# Patient Record
Sex: Male | Born: 1990 | Race: White | Hispanic: No | Marital: Single | Smoking: Current some day smoker
Health system: Southern US, Community
[De-identification: ages and names within clinical notes are randomized; demographics above are authoritative.]

---

## 2017-11-05 ENCOUNTER — Ambulatory Visit (HOSPITAL_COMMUNITY)
Admission: EM | Admit: 2017-11-05 | Discharge: 2017-11-05 | Disposition: A | Discharge status: Home / Self Care | Payer: 59 | Attending: Family Medicine | Admitting: Family Medicine

## 2017-11-05 ENCOUNTER — Ambulatory Visit (INDEPENDENT_AMBULATORY_CARE_PROVIDER_SITE_OTHER): Payer: 59

## 2017-11-05 ENCOUNTER — Encounter (HOSPITAL_COMMUNITY): Payer: Self-pay | Admitting: Emergency Medicine

## 2017-11-05 DIAGNOSIS — S2242XA Multiple fractures of ribs, left side, initial encounter for closed fracture: Secondary | ICD-10-CM

## 2017-11-05 MED ORDER — HYDROCODONE-ACETAMINOPHEN 7.5-325 MG PO TABS: 1.0000 | ORAL_TABLET | Freq: Four times a day (QID) | ORAL | 0 refills | Status: AC | PRN

## 2017-11-05 MED ORDER — IBUPROFEN 800 MG PO TABS: 800.0000 mg | ORAL_TABLET | Freq: Three times a day (TID) | ORAL | 0 refills | Status: AC | PRN

## 2017-11-05 NOTE — Discharge Instructions (Signed)
Take the ibuprofen 3 x a day with food Take the hydrocodone for more severe pain Take deep breaths frequently Watch for signs of chest infection -fever, body aches, cough with sputum production.  Return if this develops Rib fractures take 4-6 weeks to heal
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
## 2019-06-17 IMAGING — DX DG RIBS W/ CHEST 3+V*L*
3 series · 3 of 3 positions shown · non-contrast
Comparison: None.

CLINICAL DATA: Patient states that he fell and hit left ribs on
railing of porch. Pain with coughing and laughing and laying down.
Current smoker- [DATE] ppd.

EXAM:
LEFT RIBS AND CHEST - 3+ VIEW

[chest pa]
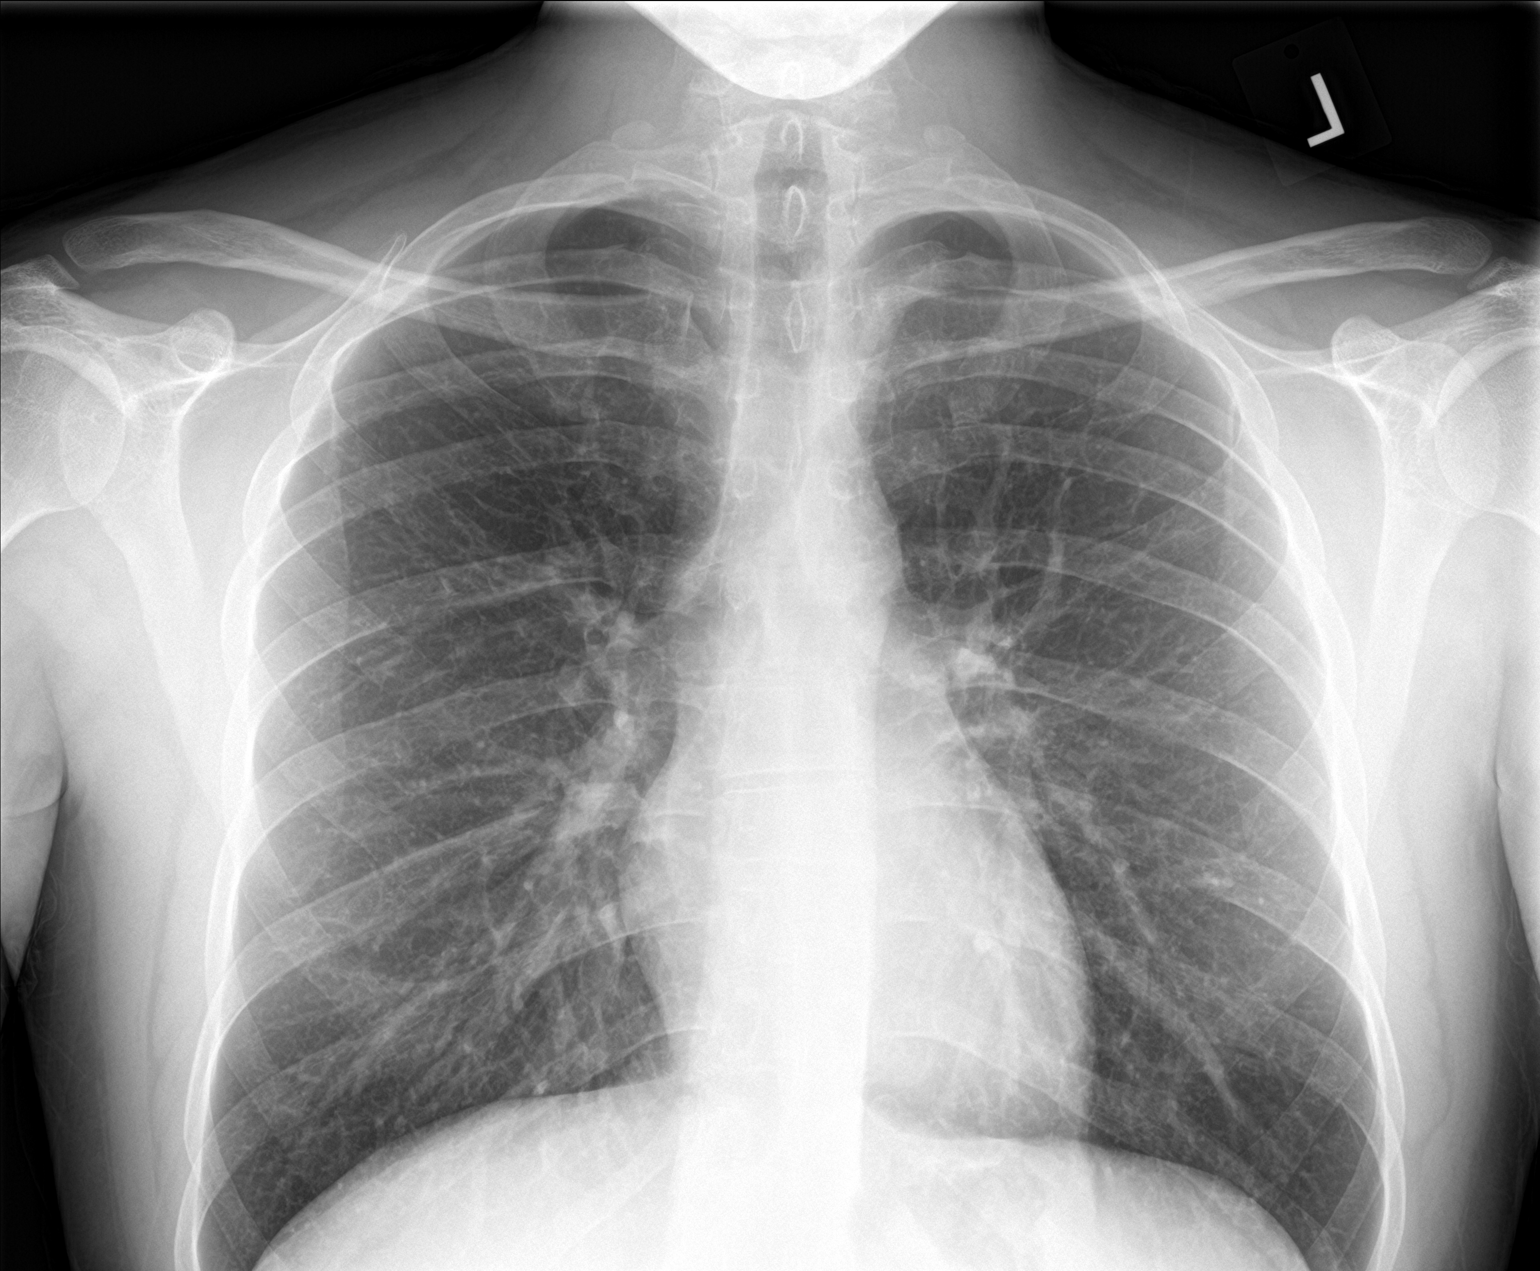

[rib obl]
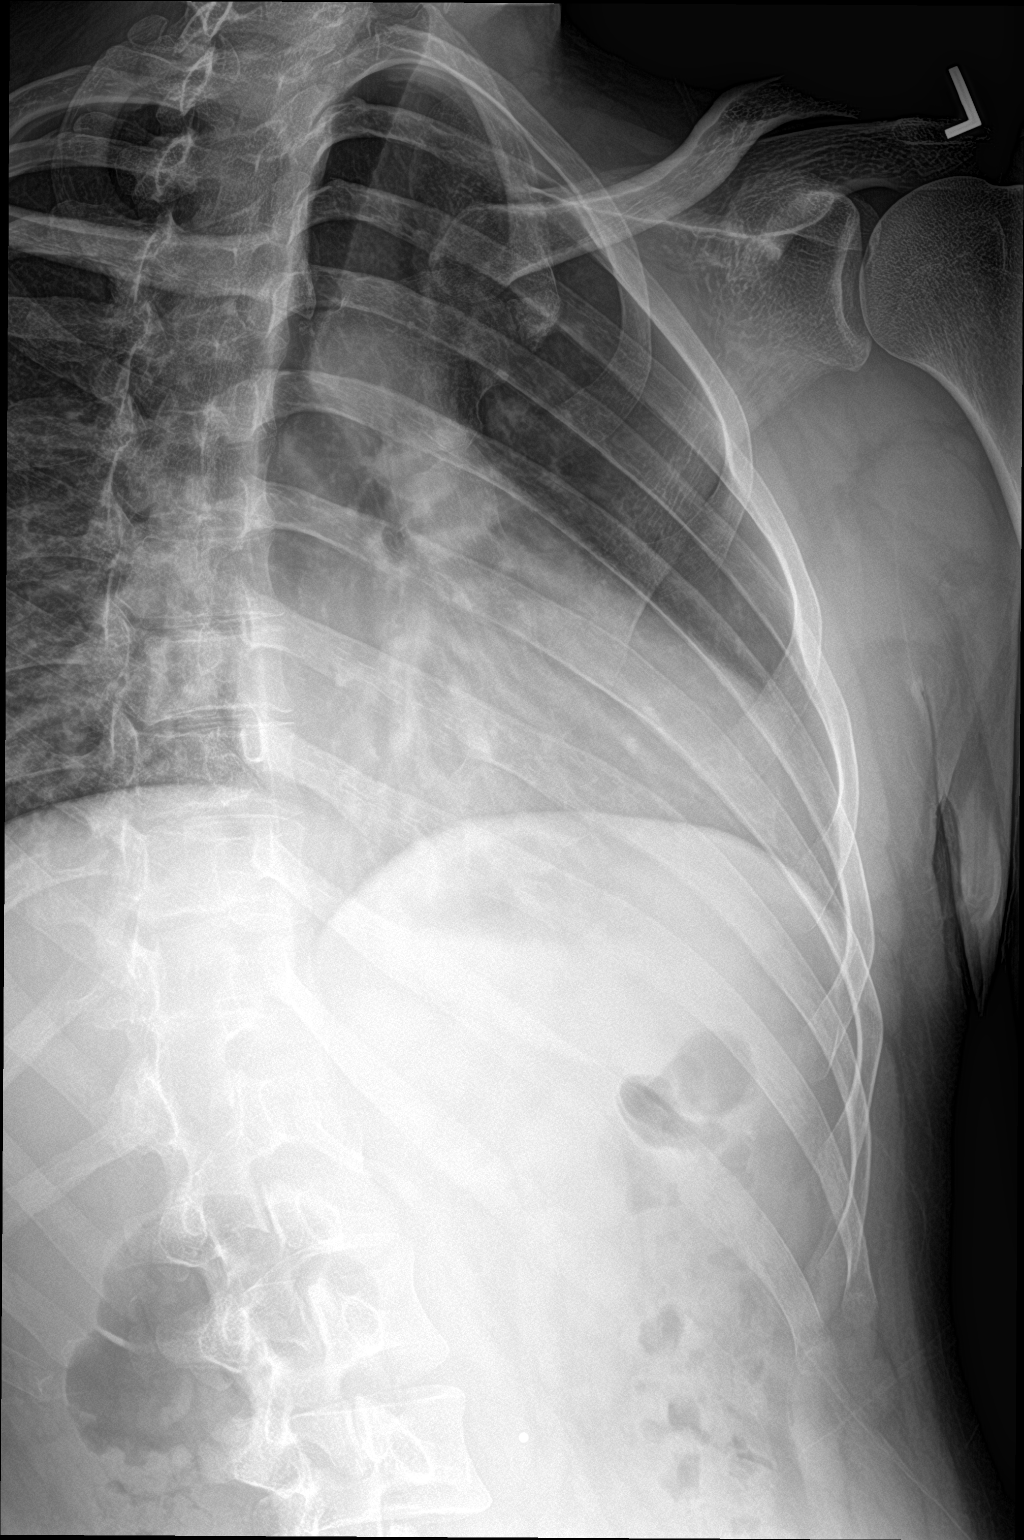

[rib pa]
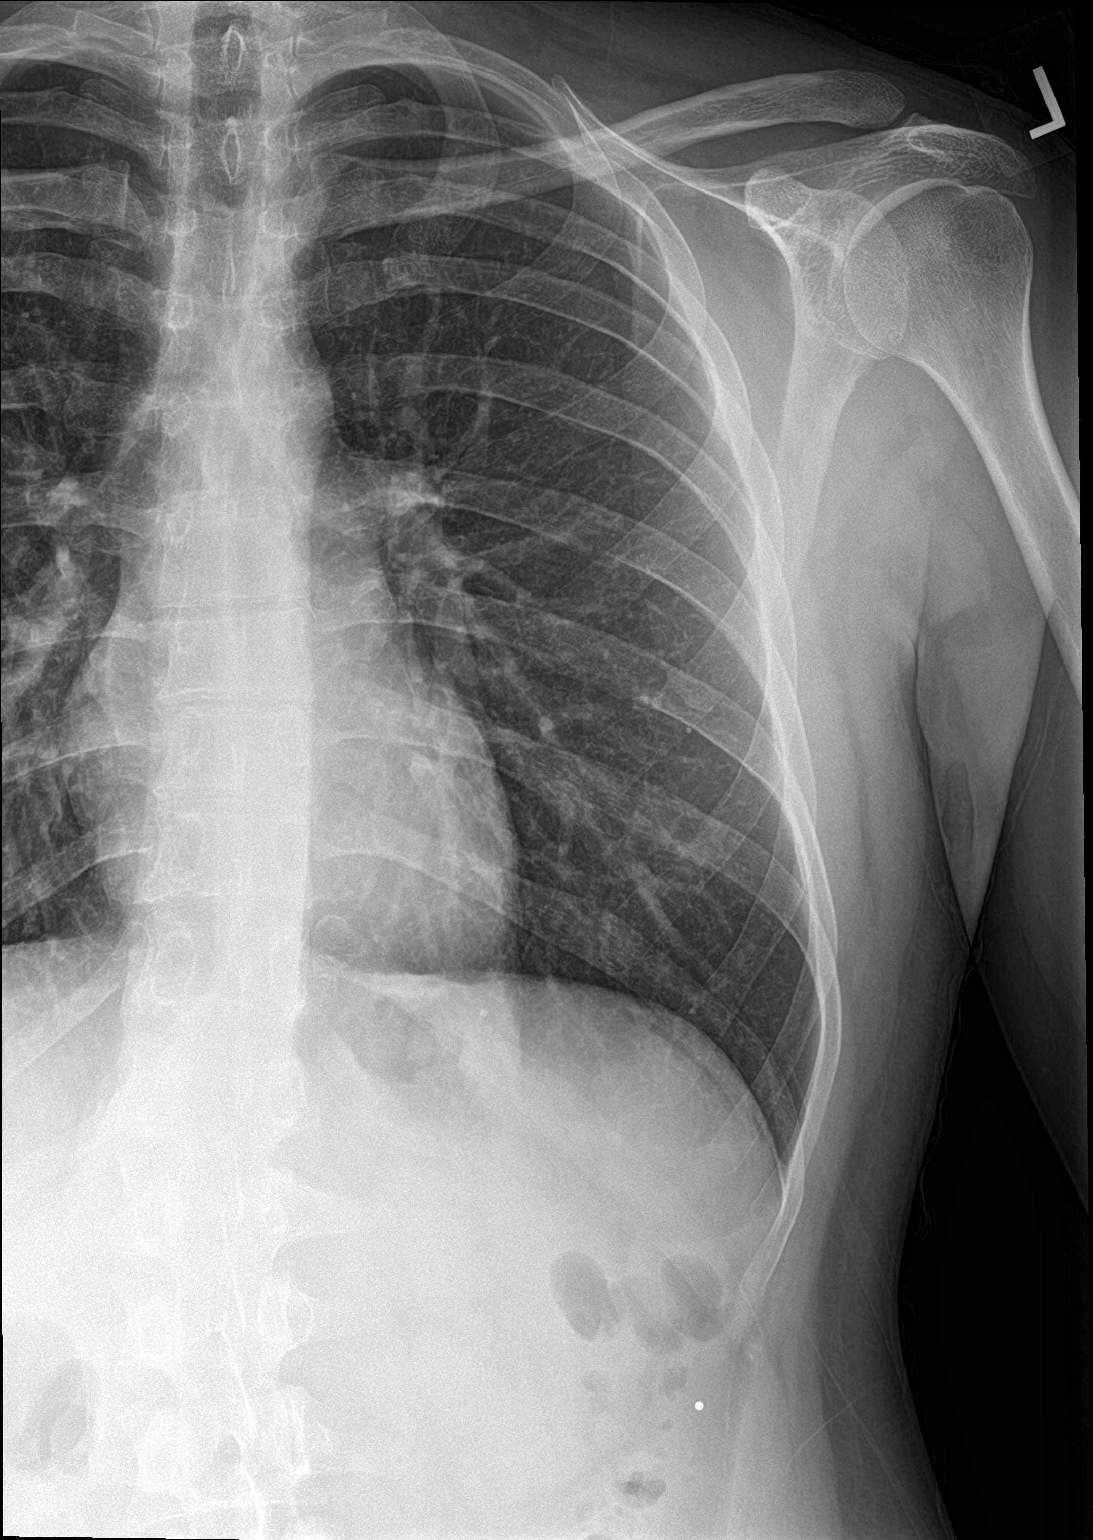

[3 of 3 positions shown; findings below may reference images not displayed]

FINDINGS: Heart size normal. The lungs are clear. There is no pneumothorax. No
focal consolidation or pleural effusion.

Oblique views of the ribs demonstrate an acute nondisplaced fracture
of the LEFT posterior 9th and 11th ribs and therefore, there is a
possible occult fracture of the LEFT 10th rib.
IMPRESSION: Acute fractures of LEFT 9th and 11th ribs, indicating high
probability of fracture of the 10th rib as well.

No pneumothorax or contusion.
